# Patient Record
Sex: Female | Born: 2000 | Race: Black or African American | Hispanic: No | Marital: Single | Smoking: Never smoker
Health system: Southern US, Community
[De-identification: ages and names within clinical notes are randomized; demographics above are authoritative.]

---

## 2021-10-23 ENCOUNTER — Other Ambulatory Visit: Payer: Self-pay

## 2021-10-23 ENCOUNTER — Emergency Department (HOSPITAL_BASED_OUTPATIENT_CLINIC_OR_DEPARTMENT_OTHER): Payer: Self-pay

## 2021-10-23 ENCOUNTER — Emergency Department (HOSPITAL_BASED_OUTPATIENT_CLINIC_OR_DEPARTMENT_OTHER)
Admission: EM | Admit: 2021-10-23 | Discharge: 2021-10-23 | Disposition: A | Discharge status: Home / Self Care | Payer: Self-pay | Attending: Emergency Medicine | Admitting: Emergency Medicine

## 2021-10-23 ENCOUNTER — Encounter (HOSPITAL_BASED_OUTPATIENT_CLINIC_OR_DEPARTMENT_OTHER): Payer: Self-pay | Admitting: Emergency Medicine

## 2021-10-23 DIAGNOSIS — J029 Acute pharyngitis, unspecified: Secondary | ICD-10-CM | POA: Insufficient documentation

## 2021-10-23 DIAGNOSIS — R590 Localized enlarged lymph nodes: Secondary | ICD-10-CM | POA: Insufficient documentation

## 2021-10-23 DIAGNOSIS — N898 Other specified noninflammatory disorders of vagina: Secondary | ICD-10-CM | POA: Insufficient documentation

## 2021-10-23 LAB — WET PREP, GENITAL
Clue Cells Wet Prep HPF POC: NONE SEEN
Sperm: NONE SEEN
Trich, Wet Prep: NONE SEEN
WBC, Wet Prep HPF POC: <10 (ref <10)
Yeast Wet Prep HPF POC: NONE SEEN

## 2021-10-23 LAB — GROUP A STREP BY PCR: Group A Strep by PCR: NOT DETECTED

## 2021-10-23 LAB — URINALYSIS, ROUTINE W REFLEX MICROSCOPIC
Bilirubin Urine: NEGATIVE
Glucose, UA: NEGATIVE mg/dL
Hgb urine dipstick: NEGATIVE
Ketones, ur: NEGATIVE mg/dL
Nitrite: NEGATIVE
Protein, ur: NEGATIVE mg/dL
Specific Gravity, Urine: 1.02 (ref 1.005–1.030)
pH: 7 (ref 5.0–8.0)

## 2021-10-23 LAB — URINALYSIS, MICROSCOPIC (REFLEX): RBC / HPF: NONE SEEN RBC/hpf (ref 0–5)

## 2021-10-23 LAB — PREGNANCY, URINE: Preg Test, Ur: NEGATIVE

## 2021-10-23 MED ORDER — FLUCONAZOLE 150 MG PO TABS
150.0000 mg | ORAL_TABLET | Freq: Once | ORAL | Status: AC
  Administered 2021-10-23: 150 mg via ORAL
  Filled 2021-10-23: qty 1

## 2021-10-23 MED ORDER — CEFTRIAXONE SODIUM 500 MG IJ SOLR
500.0000 mg | Freq: Once | INTRAMUSCULAR | Status: AC
  Administered 2021-10-23: 500 mg via INTRAMUSCULAR
  Filled 2021-10-23: qty 500

## 2021-10-23 MED ORDER — DOXYCYCLINE HYCLATE 100 MG PO CAPS: 100.0000 mg | ORAL_CAPSULE | Freq: Two times a day (BID) | ORAL | 0 refills | Status: AC

## 2021-10-23 NOTE — ED Notes (Signed)
Patient transported to Ultrasound, will get vitals updated when she returns ? ?

## 2021-10-23 NOTE — Discharge Instructions (Signed)
You are seen in the emergency department today for vaginal irritation and sore throat.  While you were here we took samples from your throat and your vagina.  For gonorrhea and chlamydia these will not come back until later and you can check your MyChart for the results.  We have already treated you for both of these with the shot and the antibiotics I have prescribed you.  You will take doxycycline twice a day for the next 7 days.  Please return to emergency department if you have worsening abdominal pain with fever.  Please abstain from sexual intercourse until your symptoms have resolved. ?

## 2021-10-23 NOTE — ED Triage Notes (Signed)
Pt arrives pov, steady gait, c/o nasal drainage with sore throat, and also reports vaginal itching and dysuria x 2 days. Denies vaginal d/c ?

## 2021-10-23 NOTE — ED Provider Notes (Signed)
?[facility] EMERGENCY DEPARTMENT ?Provider Note ? ? ?CSN: [id number] ?Arrival date & time: 10/23/21  1027 ? ?  ? ?History ? ?Chief Complaint  ?Patient presents with  ? Vaginal Irritation  ? ? ?Sara Bowen is a 21 y.o. female.  With no significant past medical history presents emergency department with sore throat and vaginal irritation. ? ?Patient states that she had unprotected sex on Tuesday with a female partner.  Not on birth control.  She states that beginning on Thursday she woke up with sore throat, rhinorrhea as well as vaginal irritation.  She is also having dysuria.  She denies any vaginal discharge.  Additionally she states she is having left lower quadrant abdominal pain that also began on Thursday.  She states that partner is not complaining of any STD symptoms.  Denies fevers.  Last menstrual period 2 weeks ago ? ?HPI ? ?  ? ?Home Medications ?Prior to Admission medications   ?Not on File  ?   ? ?Allergies    ?Patient has no allergy information on record.   ? ?Review of Systems   ?Review of Systems  ?Constitutional:  Negative for fever.  ?HENT:  Positive for sore throat.   ?Genitourinary:  Positive for dysuria and pelvic pain. Negative for vaginal discharge.  ?All other systems reviewed and are negative. ? ?Physical Exam ?Updated Vital Signs ?BP 113/75   Pulse 64   Temp 98.2 ?F (36.8 ?C)   Resp 18   Ht 5' (1.524 m)   Wt 74.8 kg   LMP 10/11/2021   SpO2 100%   BMI 32.22 kg/m?  ?Physical Exam ?Vitals and nursing note reviewed. Exam conducted with a chaperone present.  ?Constitutional:   ?   General: She is not in acute distress. ?   Appearance: Normal appearance. She is not ill-appearing or toxic-appearing.  ?HENT:  ?   Head: Normocephalic and atraumatic.  ?   Nose: Nose normal.  ?   Mouth/Throat:  ?   Mouth: Mucous membranes are moist.  ?   Pharynx: Uvula midline. Posterior oropharyngeal erythema present.  ?   Tonsils: No tonsillar abscesses. 2+ on the right. 2+ on the left.  ?Eyes:  ?    General: No scleral icterus. ?   Extraocular Movements: Extraocular movements intact.  ?Cardiovascular:  ?   Pulses: Normal pulses.  ?Pulmonary:  ?   Effort: Pulmonary effort is normal. No respiratory distress.  ?Abdominal:  ?   General: Abdomen is protuberant. Bowel sounds are normal. There is no distension.  ?   Palpations: Abdomen is soft.  ?   Tenderness: There is abdominal tenderness in the left lower quadrant.  ? ? ?Genitourinary: ?   Exam position: Lithotomy position.  ?   Vagina: Vaginal discharge present.  ?   Cervix: Discharge present. No cervical motion tenderness.  ?   Adnexa:     ?   Right: No tenderness.      ?   Left: Tenderness present.   ?   Comments: Chunky white discharge at the vaginal os and throughout the vaginal canal. ?Musculoskeletal:     ?   General: Normal range of motion.  ?   Cervical back: Neck supple.  ?Lymphadenopathy:  ?   Cervical: Cervical adenopathy present.  ?Skin: ?   General: Skin is warm and dry.  ?   Capillary Refill: Capillary refill takes less than 2 seconds.  ?   Findings: No rash.  ?Neurological:  ?   General: No focal  deficit present.  ?   Mental Status: She is alert and oriented to person, place, and time. Mental status is at baseline.  ?Psychiatric:     ?   Mood and Affect: Mood normal.     ?   Behavior: Behavior normal.     ?   Thought Content: Thought content normal.     ?   Judgment: Judgment normal.  ? ? ?ED Results / Procedures / Treatments   ?Labs ?(all labs ordered are listed, but only abnormal results are displayed) ?Labs Reviewed  ?URINALYSIS, ROUTINE W REFLEX MICROSCOPIC - Abnormal; Notable for the following components:  ?    Result Value  ? Leukocytes,Ua TRACE (*)   ? All other components within normal limits  ?URINALYSIS, MICROSCOPIC (REFLEX) - Abnormal; Notable for the following components:  ? Bacteria, UA RARE (*)   ? All other components within normal limits  ?WET PREP, GENITAL  ?GROUP A STREP BY PCR  ?PREGNANCY, URINE  ?GC/CHLAMYDIA PROBE AMP ([facility]) NOT AT [facility]  ?GC/CHLAMYDIA PROBE AMP ([place]) NOT AT [facility]  ? ?EKG ?None ? ?Radiology ?US PELVIC COMPLETE W TRANSVAGINAL AND TORSION R/O ? ?Result Date: 10/23/2021 ?CLINICAL DATA:  Left adnexal pain for the past 2 days. EXAM: TRANSABDOMINAL AND TRANSVAGINAL ULTRASOUND OF PELVIS DOPPLER ULTRASOUND OF OVARIES TECHNIQUE: Both transabdominal and transvaginal ultrasound examinations of the pelvis were performed. Transabdominal technique was performed for global imaging of the pelvis including uterus, ovaries, adnexal regions, and pelvic cul-de-sac. It was necessary to proceed with endovaginal exam following the transabdominal exam to visualize the endometrium and right ovary. Color and duplex Doppler ultrasound was utilized to evaluate blood flow to the ovaries. COMPARISON:  None. FINDINGS: Uterus Measurements: 6.5 x 2.5 x 2.6 cm = volume: 23 mL. No fibroids or other mass visualized. Endometrium Thickness: 4 mm.  No focal abnormality visualized. Right ovary Measurements: 3.3 x 1.7 x 1.7 cm = volume: 5.5 mL. Normal appearance/no adnexal mass. Left ovary Measurements: 4.0 x 3.3 x 3.2 cm = volume: 22.7 mL. 3.0 x 2.7 x 3.3 cm complex cystic lesion with reticular pattern of internal echoes. Pulsed Doppler evaluation of both ovaries demonstrates normal low-resistance arterial and venous waveforms. Other findings Trace free fluid in the pelvis, likely physiologic. IMPRESSION: 1. No ovarian torsion. 2. 3.3 cm hemorrhagic cyst in the left ovary. Given the patient's age and size of the cyst, no further follow-up is required. Electronically Signed   By: Obie Dredge M.D.   On: 10/23/2021 14:09   ? ?Procedures ?Pelvic exam ? ?Date/Time: 10/23/2021 12:36 PM ?Performed by: Cristopher Peru, PA-C ?Authorized by: Cristopher Peru, PA-C  ?Consent: Verbal consent obtained. Written consent not obtained. ?Risks and benefits: risks, benefits and alternatives were discussed ?Consent given by: patient ?Patient understanding: patient  states understanding of the procedure being performed ?Relevant documents: relevant documents present and verified ?Test results: test results available and properly labeled ?Patient identity confirmed: verbally with patient ?Time out: Immediately prior to procedure a "time out" was called to verify the correct patient, procedure, equipment, support staff and site/side marked as required. ?Preparation: Patient was prepped and draped in the usual sterile fashion. ?Local anesthesia used: no ? ?Anesthesia: ?Local anesthesia used: no ? ?Sedation: ?Patient sedated: no ? ?Patient tolerance: patient tolerated the procedure well with no immediate complications ? ?  ?Medications Ordered in ED ?Medications  ?fluconazole (DIFLUCAN) tablet 150 mg (150 mg Oral Given 10/23/21 1406)  ?cefTRIAXone (ROCEPHIN) injection 500 mg (500 mg Intramuscular Given  10/23/21 1459)  ? ? ?ED Course/ Medical Decision Making/ A&P ?  ?                        ?Medical Decision Making ?Amount and/or Complexity of Data Reviewed ?Labs: ordered. ?Radiology: ordered. ? ?Risk ?Prescription drug management. ? ?This patient presents to the ED for concern of vaginal irritations, sore throat, this involves an extensive number of treatment options, and is a complaint that carries with it a high risk of complications and morbidity.  The differential diagnosis includes STI, vaginal candidiasis, BV; strep throat, URI, gonococcal pharyngitis  ? ?Co morbidities that complicate the patient evaluation ?none ? ?Additional history obtained:  ?Additional history obtained from: none ?External records from outside source obtained and reviewed including: previous PCP visits ? ?Lab Results: ?I personally ordered, reviewed, and interpreted labs. ?Pertinent results include: ?UA with trace leukocytes, rare bacteria, no white blood cells, doubt UTI ?Urine pregnancy negative ?Wet prep negative ?Group A strep negative ?GC chlamydia oropharynx and cervical swab pending ? ?Imaging  Studies ordered:  ?I ordered imaging studies which included ultrasound.  I independently reviewed & interpreted imaging & am in agreement with radiology impression. ?Imaging shows: ?No torsion, 3 cm hemorrhagic c

## 2021-10-25 LAB — GC/CHLAMYDIA PROBE AMP (~~LOC~~) NOT AT ARMC
Chlamydia: NEGATIVE
Chlamydia: NEGATIVE
Comment: NEGATIVE
Comment: NORMAL
Comment: NEGATIVE
Comment: NORMAL
Neisseria Gonorrhea: NEGATIVE
Neisseria Gonorrhea: NEGATIVE

## 2022-08-15 ENCOUNTER — Other Ambulatory Visit: Payer: Self-pay | Admitting: Family Medicine

## 2022-08-15 ENCOUNTER — Ambulatory Visit: Payer: 59

## 2022-08-15 DIAGNOSIS — Z0000 Encounter for general adult medical examination without abnormal findings: Secondary | ICD-10-CM

## 2023-04-01 IMAGING — US US PELVIS COMPLETE TRANSABD/TRANSVAG W DUPLEX AND/OR DOPPLER
1 series · 13 of 25 positions shown · non-contrast
Comparison: None.

CLINICAL DATA: Left adnexal pain for the past 2 days.

EXAM:
TRANSABDOMINAL AND TRANSVAGINAL ULTRASOUND OF PELVIS
DOPPLER ULTRASOUND OF OVARIES
TECHNIQUE: Both transabdominal and transvaginal ultrasound examinations of the
pelvis were performed. Transabdominal technique was performed for
global imaging of the pelvis including uterus, ovaries, adnexal
regions, and pelvic cul-de-sac.
It was necessary to proceed with endovaginal exam following the
transabdominal exam to visualize the endometrium and right ovary.
Color and duplex Doppler ultrasound was utilized to evaluate blood
flow to the ovaries.

[Series 1: us pelvis complete transabd/transvag w duplex and/ · 110 acquisitions, 13 frames shown]
[im 1/110]
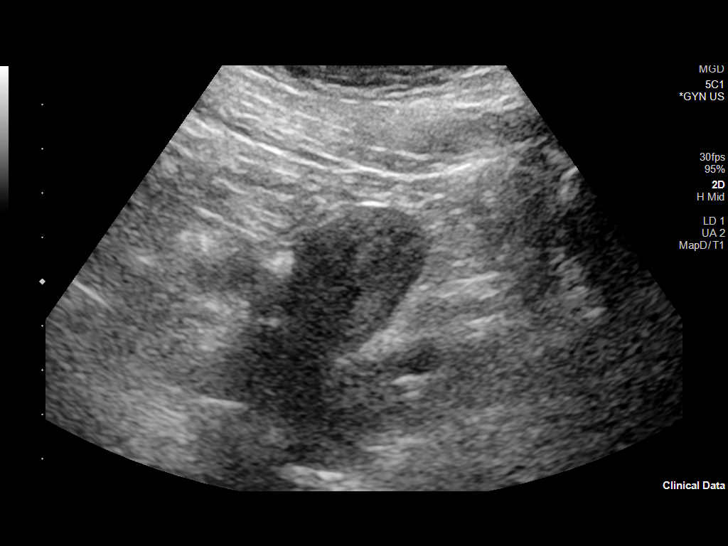
[im 10/110]
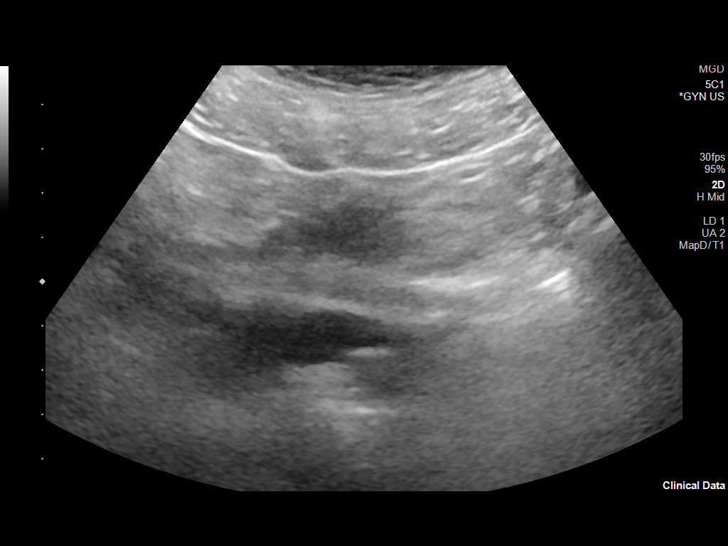
[im 19/110]
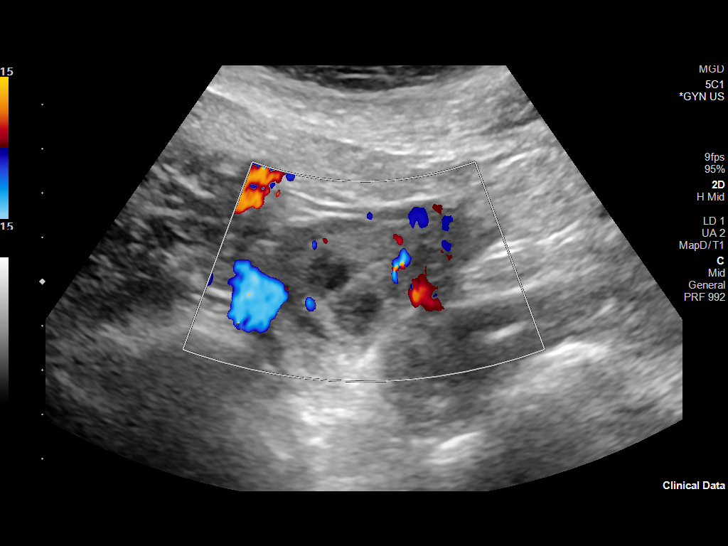
[im 28/110]
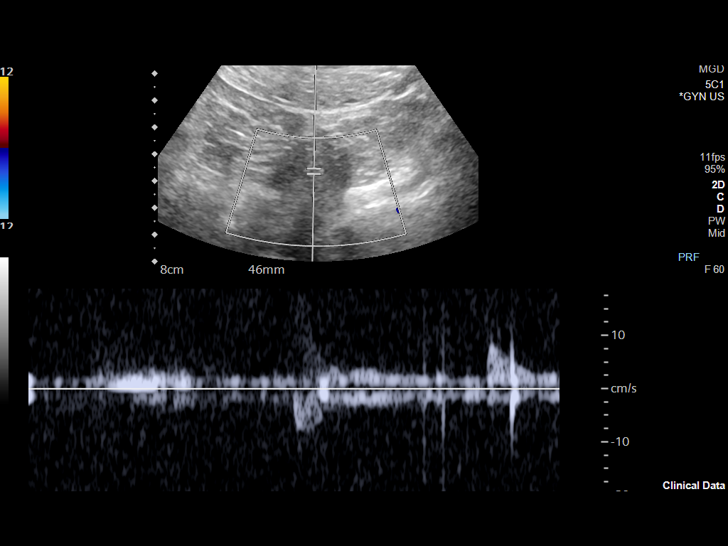
[im 37/110]
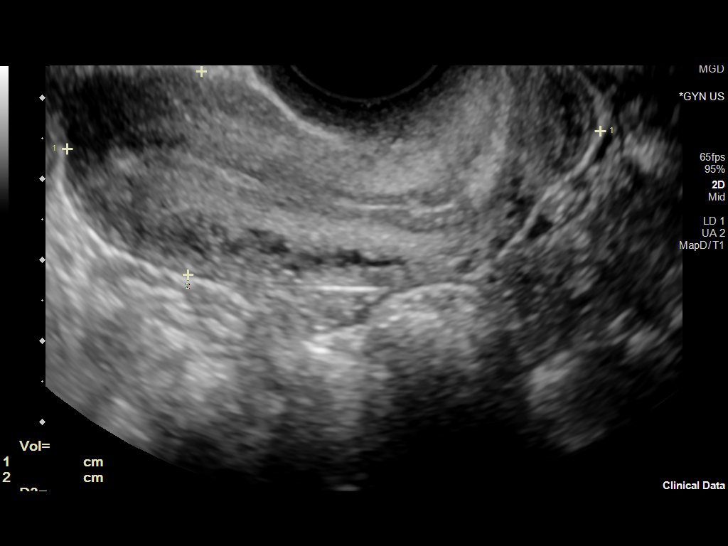
[im 46/110]
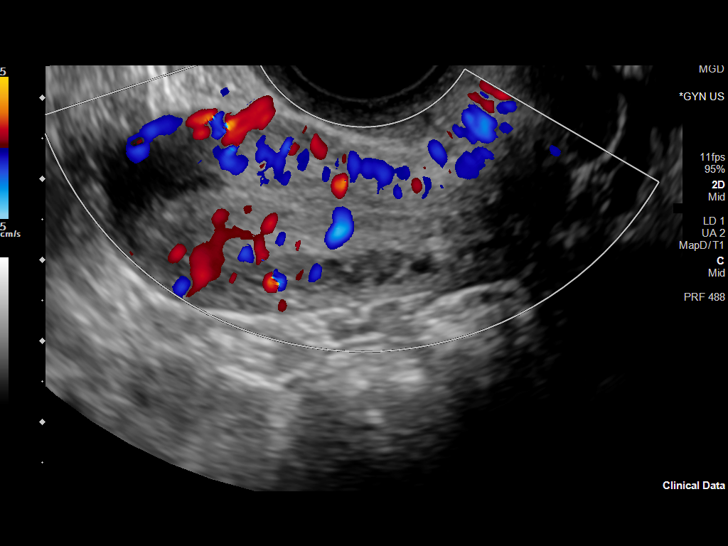
[im 55/110]
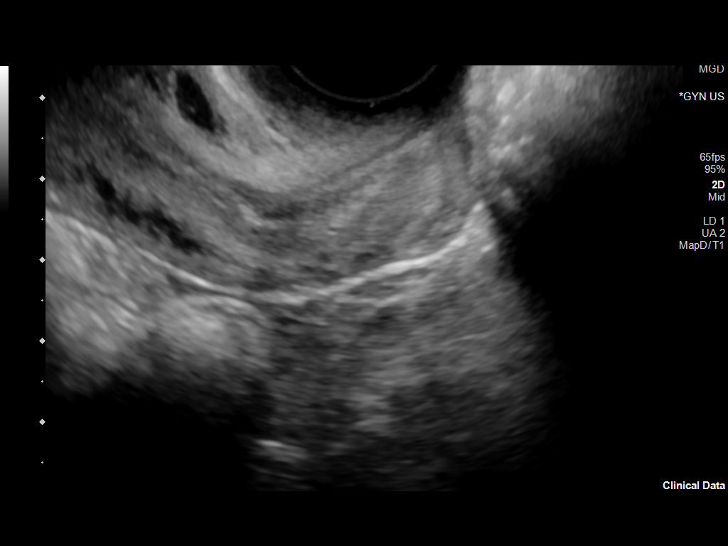
[im 64/110]
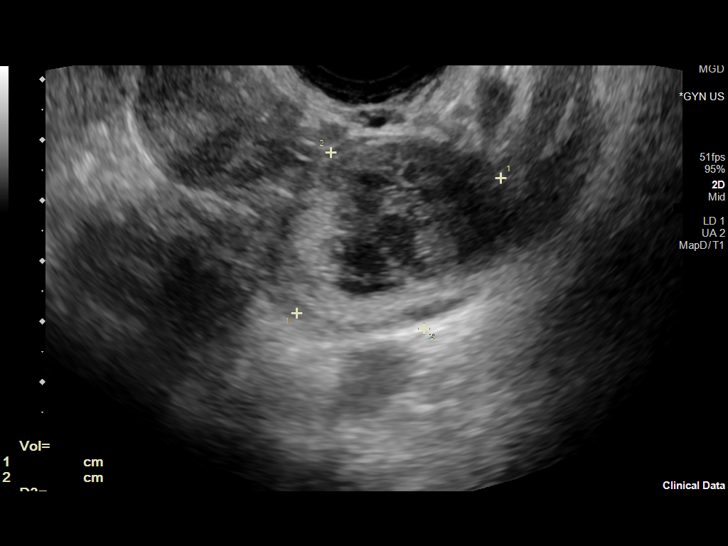
[im 73/110]
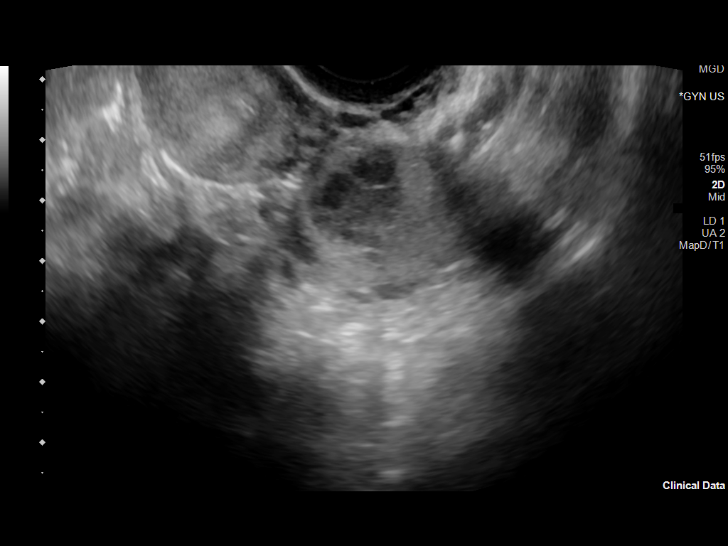
[im 82/110]
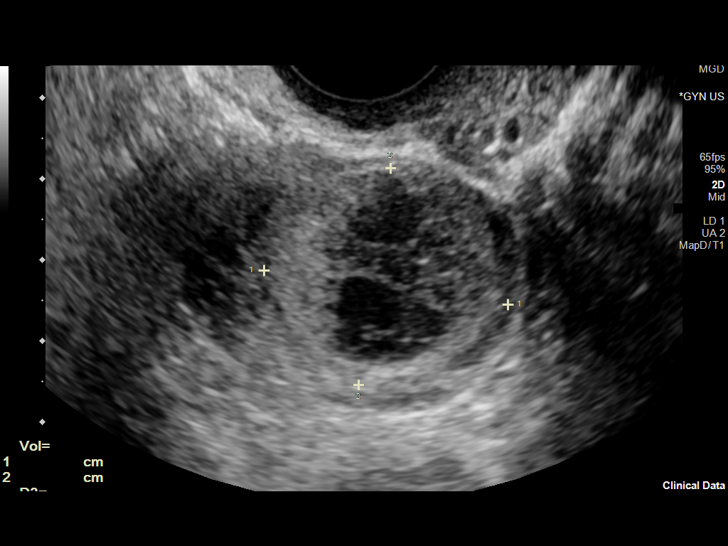
[im 91/110]
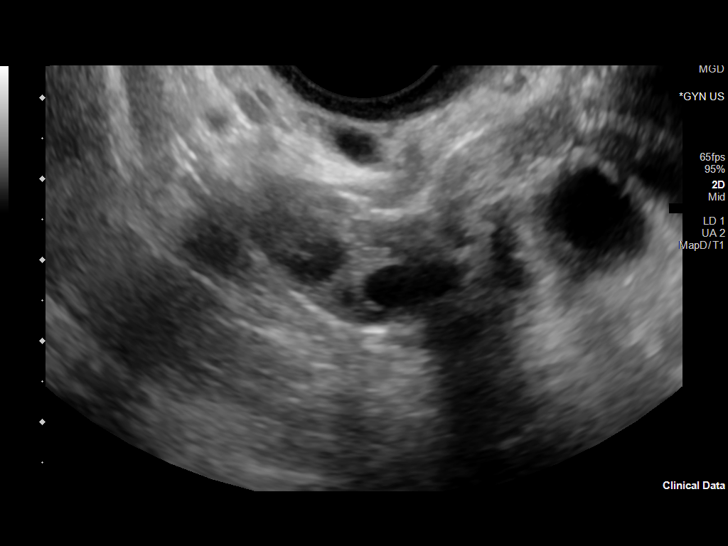
[im 100/110]
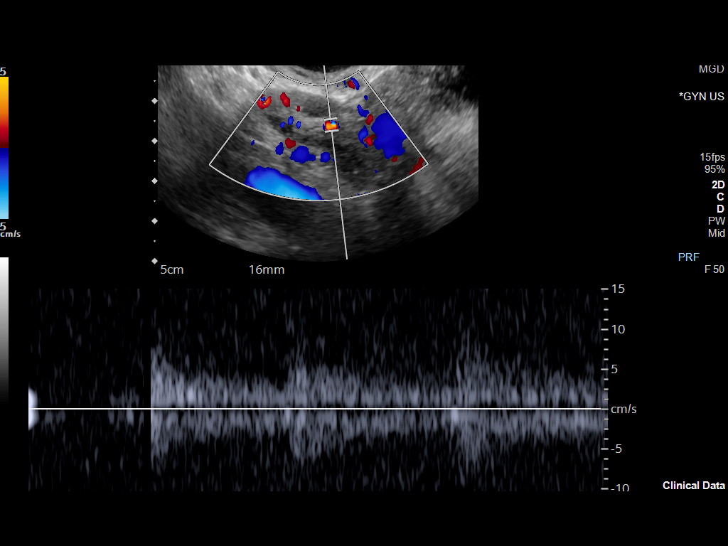
[im 110/110]
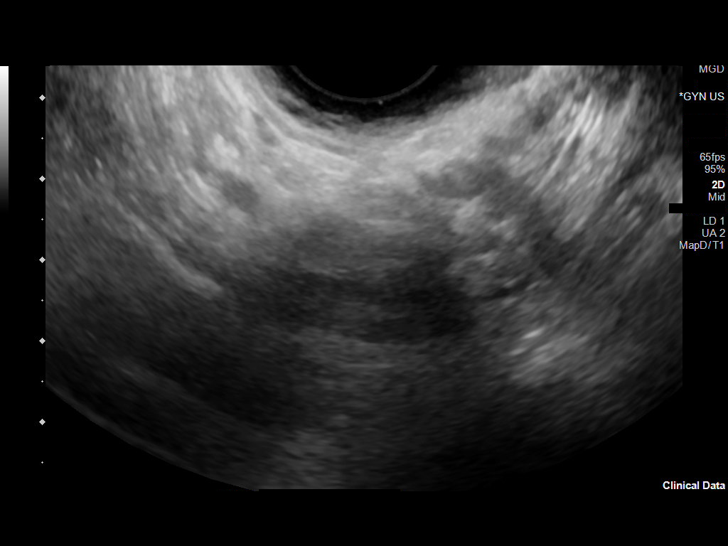

[13 of 25 positions shown; findings below may reference images not displayed]

FINDINGS: Uterus

Measurements: 6.5 x 2.5 x 2.6 cm = volume: 23 mL. No fibroids or
other mass visualized.

Endometrium

Thickness: 4 mm.  No focal abnormality visualized.

Right ovary

Measurements: 3.3 x 1.7 x 1.7 cm = volume: 5.5 mL. Normal
appearance/no adnexal mass.

Left ovary

Measurements: 4.0 x 3.3 x 3.2 cm = volume: 22.7 mL. 3.0 x 2.7 x
cm complex cystic lesion with reticular pattern of internal echoes.

Pulsed Doppler evaluation of both ovaries demonstrates normal
low-resistance arterial and venous waveforms.

Other findings

Trace free fluid in the pelvis, likely physiologic.
IMPRESSION: 1. No ovarian torsion.
2. 3.3 cm hemorrhagic cyst in the left ovary. Given the patient's
age and size of the cyst, no further follow-up is required.

## 2023-07-14 ENCOUNTER — Ambulatory Visit
Admission: RE | Admit: 2023-07-14 | Discharge: 2023-07-14 | Disposition: A | Discharge status: Home / Self Care | Payer: Medicaid Other | Source: Ambulatory Visit | Attending: Family Medicine | Admitting: Family Medicine

## 2023-07-14 VITALS — BP 114/75 | HR 63 | Temp 97.9°F | Resp 24

## 2023-07-14 DIAGNOSIS — J392 Other diseases of pharynx: Secondary | ICD-10-CM

## 2023-07-14 DIAGNOSIS — T50905A Adverse effect of unspecified drugs, medicaments and biological substances, initial encounter: Secondary | ICD-10-CM

## 2023-07-14 MED ORDER — HYDROXYZINE HCL 25 MG PO TABS
12.5000 mg | ORAL_TABLET | Freq: Three times a day (TID) | ORAL | 0 refills | Status: AC | PRN
Start: 2023-07-14 — End: ?

## 2023-07-14 MED ORDER — PREDNISONE 10 MG PO TABS: 30.0000 mg | ORAL_TABLET | Freq: Every day | ORAL | 0 refills | Status: AC

## 2023-07-14 NOTE — ED Triage Notes (Addendum)
Pt c/o "head pressure, trouble getting my food down" started 12/1-states sx started same day she took someone else's adderall-taking benadryl and ibuprofen yesterday-NAD-steady gait

## 2023-07-14 NOTE — ED Provider Notes (Signed)
[facility]  Note:  This document was prepared using Conservation officer, historic buildings and may include unintentional dictation errors.  MRN: [id number] DOB: Sep 06, 2000  Subjective:   Sara Bowen is a 22 y.o. female presenting for 5-day history of persistent throat swelling sensation, tingling of the lips.  Symptoms started after she took someone else's Adderall medication.  Has been using Benadryl and ibuprofen since yesterday.  No shortness of breath, wheezing, nausea, vomiting.  No lip swelling, facial swelling.  No current facility-administered medications for this encounter. No current outpatient medications on file.   No Known Allergies  History reviewed. No pertinent past medical history.   History reviewed. No pertinent surgical history.  No family history on file.  Social History   Tobacco Use   Smoking status: Never   Smokeless tobacco: Never  Vaping Use   Vaping status: Some Days  Substance Use Topics   Alcohol use: Yes    Comment: occ   Drug use: Yes    Types: Marijuana    ROS   Objective:   Vitals: BP 114/75 (BP Location: Left Arm)   Pulse 63   Temp 97.9 F (36.6 C) (Oral)   Resp (!) 24   LMP 06/26/2023   SpO2 97%   Physical Exam Constitutional:      General: She is not in acute distress.    Appearance: Normal appearance. She is well-developed and normal weight. She is not ill-appearing, toxic-appearing or diaphoretic.  HENT:     Head: Normocephalic and atraumatic.     Right Ear: Tympanic membrane, ear canal and external ear normal. No drainage or tenderness. No middle ear effusion. There is no impacted cerumen. Tympanic membrane is not erythematous or bulging.     Left Ear: Tympanic membrane, ear canal and external ear normal. No drainage or tenderness.  No middle ear effusion. There is no impacted cerumen. Tympanic membrane is not erythematous or bulging.     Nose: Nose normal. No congestion or rhinorrhea.      Mouth/Throat:     Mouth: Mucous membranes are moist. No oral lesions.     Pharynx: No pharyngeal swelling, oropharyngeal exudate, posterior oropharyngeal erythema or uvula swelling.     Tonsils: No tonsillar exudate or tonsillar abscesses.     Comments: No facial or oral swelling.  Airway is patent.  Patient is controlling secretions. Eyes:     General: No scleral icterus.       Right eye: No discharge.        Left eye: No discharge.     Extraocular Movements: Extraocular movements intact.     Right eye: Normal extraocular motion.     Left eye: Normal extraocular motion.     Conjunctiva/sclera: Conjunctivae normal.  Cardiovascular:     Rate and Rhythm: Normal rate and regular rhythm.     Heart sounds: Normal heart sounds. No murmur heard.    No friction rub. No gallop.  Pulmonary:     Effort: Pulmonary effort is normal. No respiratory distress.     Breath sounds: No stridor. No wheezing, rhonchi or rales.  Chest:     Chest wall: No tenderness.  Musculoskeletal:     Cervical back: Normal range of motion and neck supple.  Lymphadenopathy:     Cervical: No cervical adenopathy.  Skin:    General: Skin is warm and dry.  Neurological:     General: No focal deficit present.     Mental Status: She is alert and  oriented to person, place, and time.  Psychiatric:        Mood and Affect: Mood normal.        Behavior: Behavior normal.     Assessment and Plan :   PDMP not reviewed this encounter.  1. Medication reaction, initial encounter   2. Swelling of throat    Recommended prednisone and hydroxyzine for suspected medication reaction.  Recommended she avoid all medications not prescribed to the patient directly.  Counseled patient on potential for adverse effects with medications prescribed/recommended today, ER and return-to-clinic precautions discussed, patient verbalized understanding.    Wallis Bamberg, [place] 07/14/23 1319

## 2024-01-10 ENCOUNTER — Other Ambulatory Visit: Payer: Self-pay

## 2024-01-10 ENCOUNTER — Emergency Department (HOSPITAL_COMMUNITY)
Admission: EM | Admit: 2024-01-10 | Discharge: 2024-01-10 | Discharge status: Against Medical Advice | Attending: Emergency Medicine | Admitting: Emergency Medicine

## 2024-01-10 DIAGNOSIS — Z3A00 Weeks of gestation of pregnancy not specified: Secondary | ICD-10-CM | POA: Insufficient documentation

## 2024-01-10 DIAGNOSIS — Z5321 Procedure and treatment not carried out due to patient leaving prior to being seen by health care provider: Secondary | ICD-10-CM | POA: Diagnosis not present

## 2024-01-10 DIAGNOSIS — O26891 Other specified pregnancy related conditions, first trimester: Secondary | ICD-10-CM | POA: Insufficient documentation

## 2024-01-10 DIAGNOSIS — R1030 Lower abdominal pain, unspecified: Secondary | ICD-10-CM | POA: Insufficient documentation

## 2024-01-10 LAB — PREGNANCY, URINE: Preg Test, Ur: POSITIVE — AB

## 2024-01-10 NOTE — ED Triage Notes (Signed)
 Pt complains of low abd cramping. Took pregnancy test at home and said positive. Pt wants to confirm that she is pregnant. Last cycles 12/07/23

## 2024-02-29 ENCOUNTER — Encounter: Admitting: Obstetrics and Gynecology
